# Patient Record
Sex: Female | Born: 1990 | Hispanic: Yes | Marital: Single | Smoking: Never smoker
Health system: Southern US, Community
[De-identification: ages and names within clinical notes are randomized; demographics above are authoritative.]

---

## 2011-05-02 ENCOUNTER — Other Ambulatory Visit (HOSPITAL_COMMUNITY): Payer: Self-pay | Admitting: Family Medicine

## 2011-05-14 ENCOUNTER — Ambulatory Visit (HOSPITAL_COMMUNITY)
Admission: RE | Admit: 2011-05-14 | Discharge: 2011-05-14 | Disposition: A | Discharge status: Home / Self Care | Payer: PRIVATE HEALTH INSURANCE | Source: Ambulatory Visit | Attending: Family Medicine | Admitting: Family Medicine

## 2011-05-14 DIAGNOSIS — N63 Unspecified lump in unspecified breast: Secondary | ICD-10-CM | POA: Insufficient documentation

## 2012-07-30 IMAGING — US US BREAST*L*
1 series · 6 of 6 positions shown · non-contrast
Comparison: None.

CLINICAL DATA: Left breast mass.

LEFT BREAST ULTRASOUND

[Series 1: us breast*left* · 0.08mm/px · 6 of 6 slices shown]
[im 1/6]
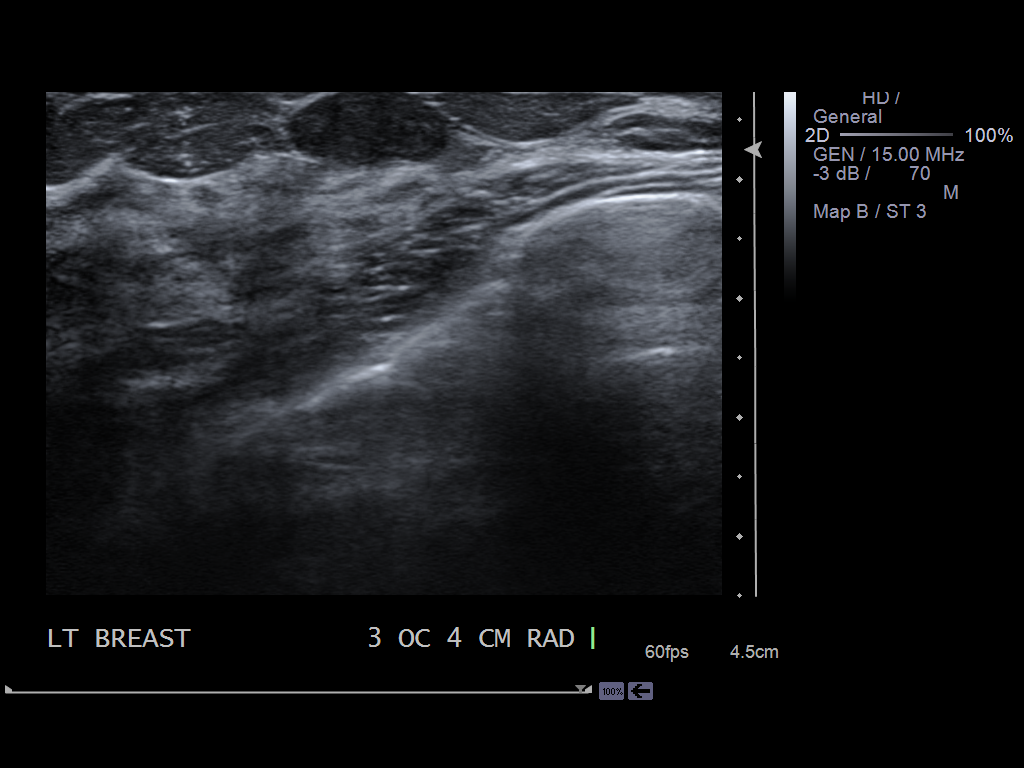
[im 2/6]
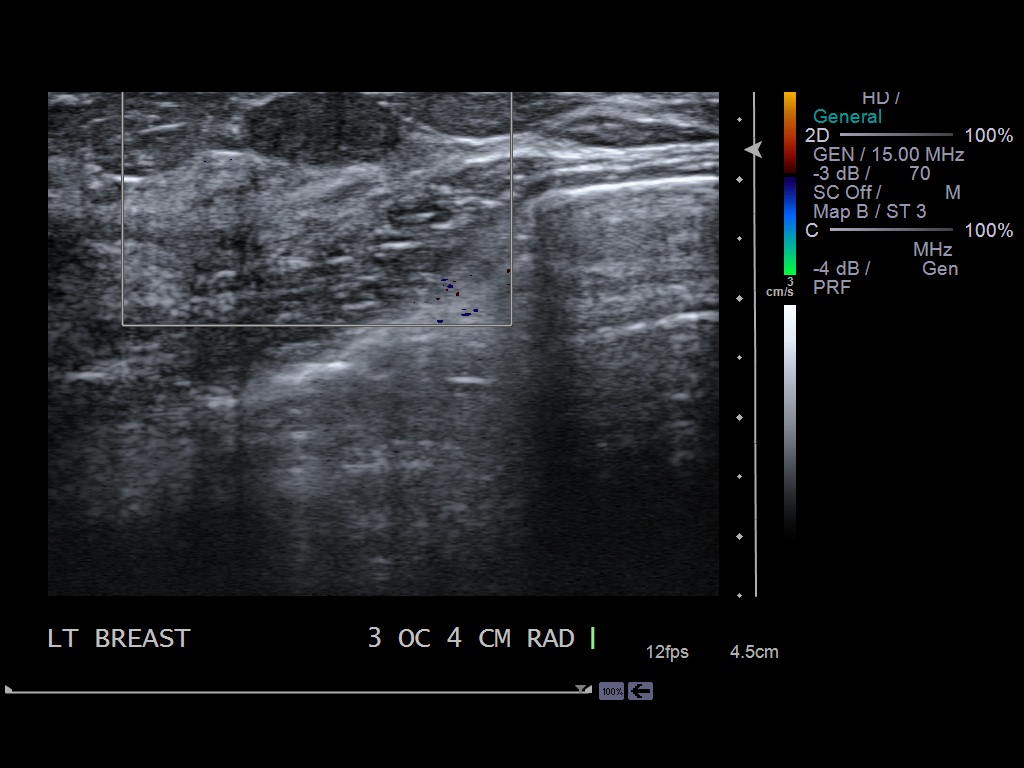
[im 3/6]
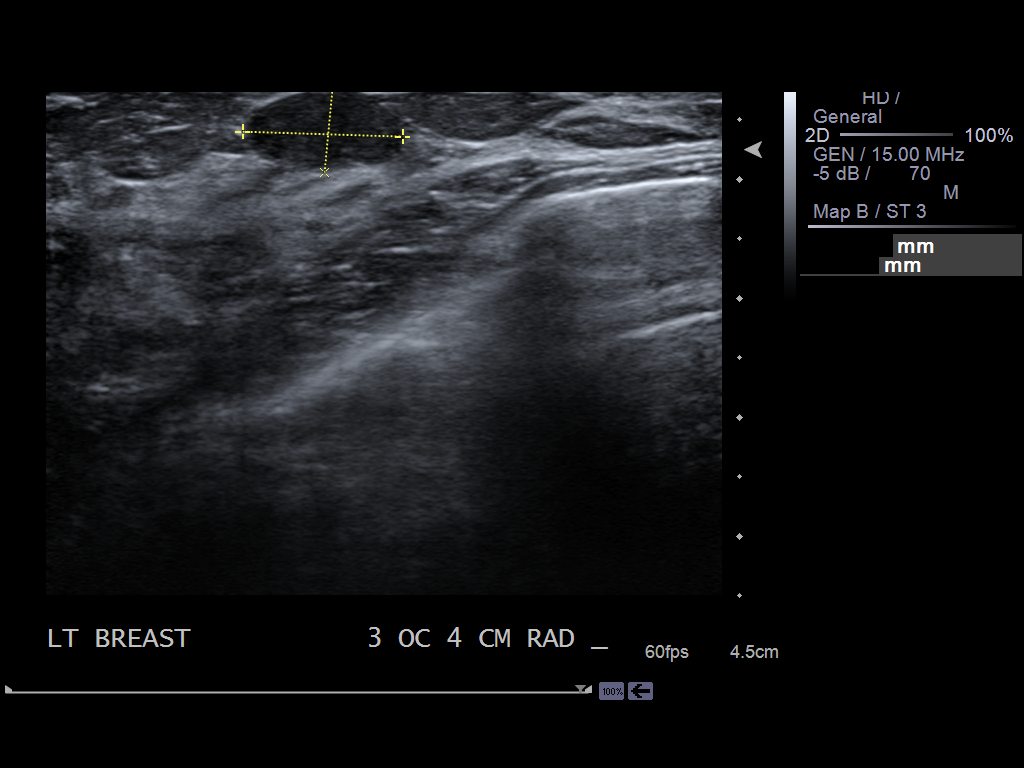
[im 4/6]
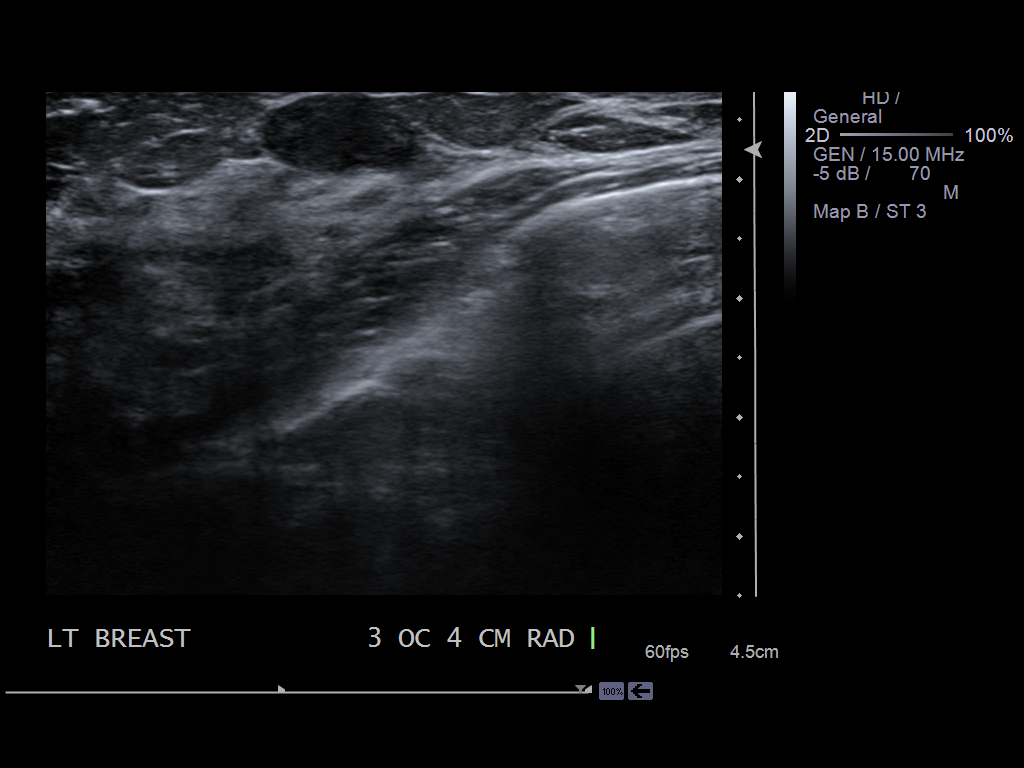
[im 5/6]
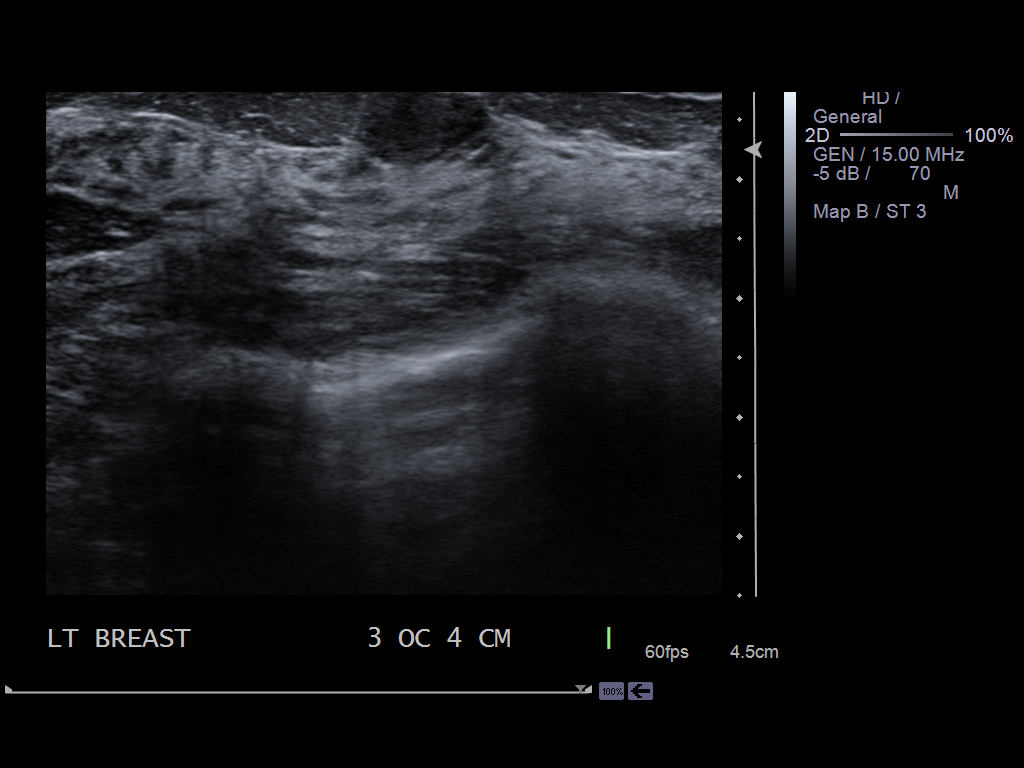
[im 6/6]
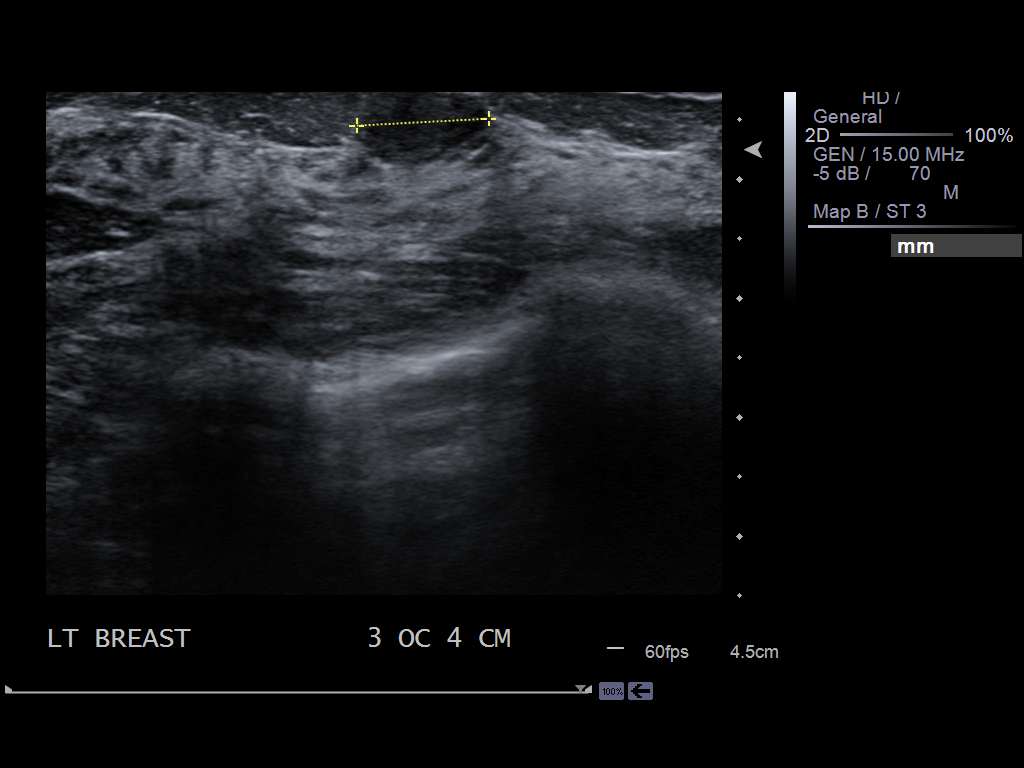

[6 of 6 positions shown; findings below may reference images not displayed]

On physical exam, there is a mobile, firm, palpable mass located
within the left breast at the 3 o'clock position 4 cm from the
nipple.  By physical examination this measures approximately 1 cm
in size.
FINDINGS: Ultrasound is performed, showing an oval, circumscribed,
hypoechoic mass located within the left breast at the 3 o'clock
position 4 cm from nipple corresponding to the palpable finding.
This is oriented parallel to the chest wall.  The features are most
consistent with a probable fibroadenoma. This measures 1.3 x 1.1 x
0.7 cm in size.  I have discussed options of tissue sampling via
ultrasound-guided core biopsy versus ultrasound follow-up
evaluation at six, 12, and 24 months.  The patient prefers
ultrasound follow-up evaluation.  I agree with this plan.  The
patient understands the importance of interim breast self-
examination  and she is instructed to contact  her referring
physician if the mass increases in size in the interim.
IMPRESSION: 1.3 cm probably benign mass (probable fibroadenoma) located within
the left breast at the 3 o'clock position 4 cm from nipple.
Recommend follow-up left breast ultrasound in 6 months.

BI-RADS CATEGORY 3:  Probably benign finding(s) - short interval
follow-up suggested.

## 2016-02-16 ENCOUNTER — Encounter (HOSPITAL_COMMUNITY): Payer: Self-pay | Admitting: Emergency Medicine

## 2016-02-16 ENCOUNTER — Emergency Department (HOSPITAL_COMMUNITY)
Admission: EM | Admit: 2016-02-16 | Discharge: 2016-02-16 | Disposition: A | Discharge status: Home / Self Care | Payer: Self-pay | Attending: Emergency Medicine | Admitting: Emergency Medicine

## 2016-02-16 ENCOUNTER — Emergency Department (HOSPITAL_COMMUNITY): Payer: Self-pay

## 2016-02-16 DIAGNOSIS — L600 Ingrowing nail: Secondary | ICD-10-CM | POA: Insufficient documentation

## 2016-02-16 MED ORDER — CEPHALEXIN 500 MG PO CAPS: 500.0000 mg | ORAL_CAPSULE | Freq: Four times a day (QID) | ORAL | Status: AC

## 2016-02-16 MED ORDER — NAPROXEN 500 MG PO TABS: 500.0000 mg | ORAL_TABLET | Freq: Two times a day (BID) | ORAL | Status: AC

## 2016-02-16 MED ORDER — LIDOCAINE HCL (PF) 2 % IJ SOLN
2.0000 mL | Freq: Once | INTRAMUSCULAR | Status: AC
  Administered 2016-02-16: 2 mL
  Filled 2016-02-16: qty 10

## 2016-02-16 NOTE — ED Provider Notes (Signed)
CSN: [id number]     Arrival date & time 02/16/16  1002 History   First MD Initiated Contact with Patient 02/16/16 1010     Chief Complaint  Patient presents with  . Toe Pain     (Consider location/radiation/quality/duration/timing/severity/associated sxs/prior Treatment) The history is provided by the patient and a relative.   Traci Casey is a 25 y.o. female presenting with an infected ingrown toenail of her left great toe.  She endorses chronic ingrown nail which has worsened in pain, swelling and now drainage of blood and pus since dropping a wooden board on it 3 days ago.  She tried to remove the ingrown nail spike using a tweezers and nail clipper but was unsuccessful.  She denies radiation of pain and other complaint.     History reviewed. No pertinent past medical history. History reviewed. No pertinent past surgical history. History reviewed. No pertinent family history. Social History  Substance Use Topics  . Smoking status: Never Smoker   . Smokeless tobacco: Never Used  . Alcohol Use: No   OB History    Gravida Para Term Preterm AB TAB SAB Ectopic Multiple Living   Review of Systems  Constitutional: Negative for fever.  Musculoskeletal: Positive for arthralgias. Negative for myalgias and joint swelling.  Skin: Positive for color change.  Neurological: Negative for weakness and numbness.      Allergies  Review of patient's allergies indicates no known allergies.  Home Medications   Prior to Admission medications   Medication Sig Start Date End Date Taking? Authorizing Provider  cephALEXin (KEFLEX) 500 MG capsule Take 1 capsule (500 mg total) by mouth 4 (four) times daily. 02/16/16   Burgess Amor, PA-C  naproxen (NAPROSYN) 500 MG tablet Take 1 tablet (500 mg total) by mouth 2 (two) times daily. 02/16/16   Burgess Amor, PA-C   BP 108/72 mmHg  Pulse 98  Temp(Src) 97.9 F (36.6 C) (Oral)  Resp 16  Ht  (1.6 m)  Wt 58.06 kg  BMI 22.68  kg/m2  SpO2 100%  LMP 01/03/2016 Physical Exam  Constitutional: She appears well-developed and well-nourished.  HENT:  Head: Atraumatic.  Neck: Normal range of motion.  Cardiovascular:  Pulses equal bilaterally  Musculoskeletal: She exhibits tenderness.       Left foot: There is swelling. There is normal capillary refill, no crepitus and no deformity.  Edema and erythema with bloody dried drainage around the cuticle edge.  Unable to visualize nail tip due to swelling.  Neurological: She is alert. She has normal strength. She displays normal reflexes. No sensory deficit.  Skin: Skin is warm and dry.  Psychiatric: She has a normal mood and affect.    ED Course  .Nail Removal Date/Time: 02/16/2016 12:20 PM Performed by: Burgess Amor Authorized by: Burgess Amor Consent: Verbal consent obtained. Risks and benefits: risks, benefits and alternatives were discussed Consent given by: patient Patient identity confirmed: verbally with patient Location: left foot Location details: left big toe Anesthesia: digital block Local anesthetic: lidocaine 2% without epinephrine Anesthetic total: 3 ml Preparation: skin prepped with Betadine Amount removed: 1/5 (distal wedge spike) Wedge excision of skin of nail fold: no Nail bed sutured: no Nail matrix removed: none Patient tolerance: Patient tolerated the procedure well with no immediate complications   (including critical care time)   Labs Review Labs Reviewed - No data to display  Imaging Review Dg Toe Great Left  02/16/2016  CLINICAL DATA:  25 year old female with a history of left great toe injury EXAM: LEFT GREAT TOE COMPARISON:  None. FINDINGS: No acute fracture. No radiopaque foreign body. Unremarkable appearance of the interphalangeal joint. No subluxation/dislocation. IMPRESSION: No acute bony abnormality identified. Signed, Yvone NeuJaime S. Loreta AveWagner, DO [facility] Electronically  Signed   By: Gilmer MorJaime  Wagner D.O.   On: 02/16/2016 11:43   I have personally reviewed and evaluated these images and lab results as part of my medical decision-making.   EKG Interpretation None      MDM   Final diagnoses:  Ingrown left greater toenail    Keflex, naproxen, warm soaks, prn f/u with podiatry, referral given.    Burgess AmorJulie Nicolaos Mitrano, PA-C 02/16/16 1224  Samuel JesterKathleen McManus, DO 02/17/16 [phone]

## 2016-02-16 NOTE — ED Notes (Signed)
Patient c/o left great toe pain. Per patient has ingrown toe tail and injured toe 3 days ago at work making pain worse.

## 2016-02-16 NOTE — Discharge Instructions (Signed)
Ingrown Toenail An ingrown toenail occurs when the corner or sides of your toenail grow into the surrounding skin. The big toe is most commonly affected, but it can happen to any of your toes. If your ingrown toenail is not treated, you will be at risk for infection. CAUSES This condition may be caused by:  Wearing shoes that are too small or tight.  Injury or trauma, such as stubbing your toe or having your toe stepped on.  Improper cutting or care of your toenails.  Being born with (congenital) nail or foot abnormalities, such as having a nail that is too big for your toe. RISK FACTORS Risk factors for an ingrown toenail include:  Age. Your nails tend to thicken as you get older, so ingrown nails are more common in older people.  Diabetes.  Cutting your toenails incorrectly.  Blood circulation problems. SYMPTOMS Symptoms may include:  Pain, soreness, or tenderness.  Redness.  Swelling.  Hardening of the skin surrounding the toe. Your ingrown toenail may be infected if there is fluid, pus, or drainage. DIAGNOSIS  An ingrown toenail may be diagnosed by medical history and physical exam. If your toenail is infected, your health care provider may test a sample of the drainage. TREATMENT Treatment depends on the severity of your ingrown toenail. Some ingrown toenails may be treated at home. More severe or infected ingrown toenails may require surgery to remove all or part of the nail. Infected ingrown toenails may also be treated with antibiotic medicines. HOME CARE INSTRUCTIONS  If you were prescribed an antibiotic medicine, finish all of it even if you start to feel better.  Soak your foot in warm  water mixed with EPSOM SALT for 20 minutes, 2 times per day or as directed by your health care provider.  Carefully lift the edge of the nail away from the sore skin by wedging a small piece of cotton under the corner of the nail. This may help with the pain. Be careful not to  cause more injury to the area.  Wear shoes that fit well. If your ingrown toenail is causing you pain, try wearing sandals, if possible.  Trim your toenails regularly and carefully. Do not cut them in a curved shape. Cut your toenails straight across. This prevents injury to the skin at the corners of the toenail.  Keep your feet clean and dry.  If you are having trouble walking and are given crutches by your health care provider, use them as directed.  Do not pick at your toenail or try to remove it yourself.  Take medicines only as directed by your health care provider.  Keep all follow-up visits as directed by your health care provider. This is important. SEEK MEDICAL CARE IF:  Your symptoms do not improve with treatment. SEEK IMMEDIATE MEDICAL CARE IF:  You have red streaks that start at your foot and go up your leg.  You have a fever.  You have increased redness, swelling, or pain.  You have fluid, blood, or pus coming from your toenail.   This information is not intended to replace advice given to you by your health care provider. Make sure you discuss any questions you have with your health care provider.   Document Released: 07/18/2000 Document Revised: 12/05/2014 Document Reviewed: 06/14/2014 Elsevier Interactive Patient Education [employer]2016 [employer]

## 2017-05-04 IMAGING — CR DG TOE GREAT 2+V*L*
3 series · 3 of 3 positions shown · non-contrast
Comparison: None.

CLINICAL DATA: 25-year-old female with a history of left great toe
injury

EXAM:
LEFT GREAT TOE

[ap]
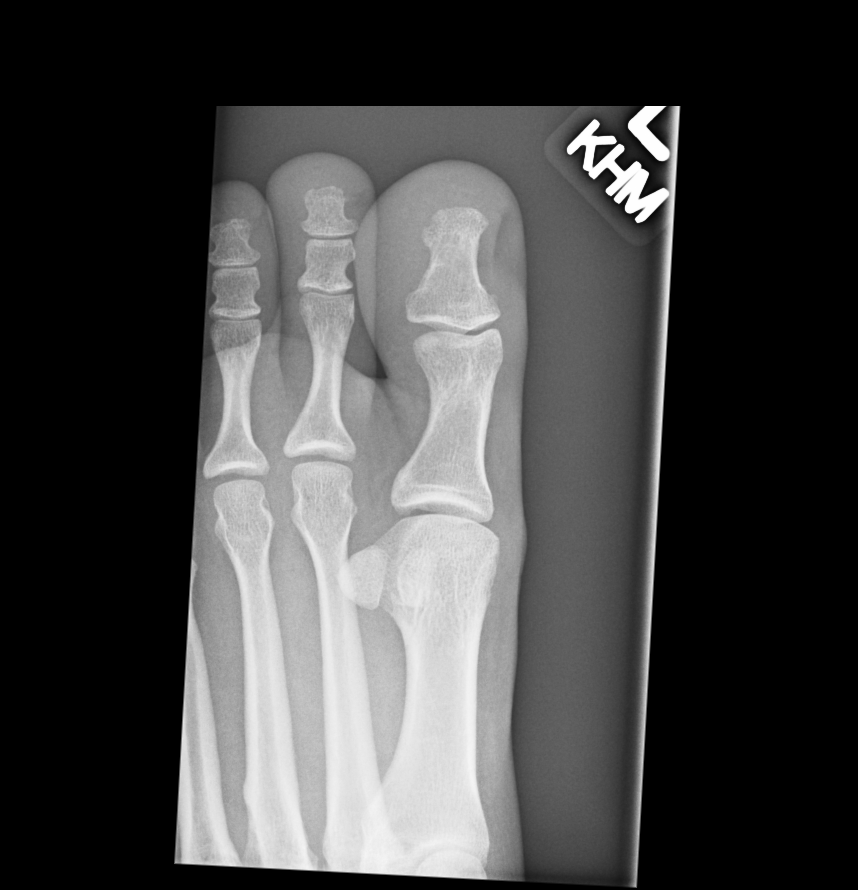

[oblique]
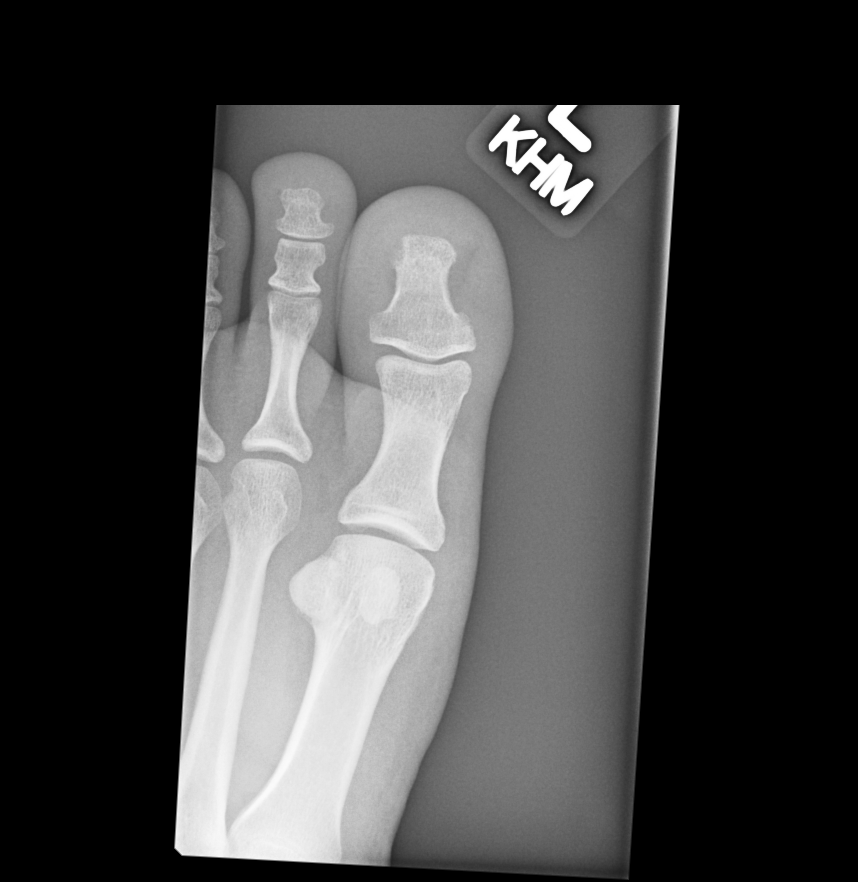

[lat]
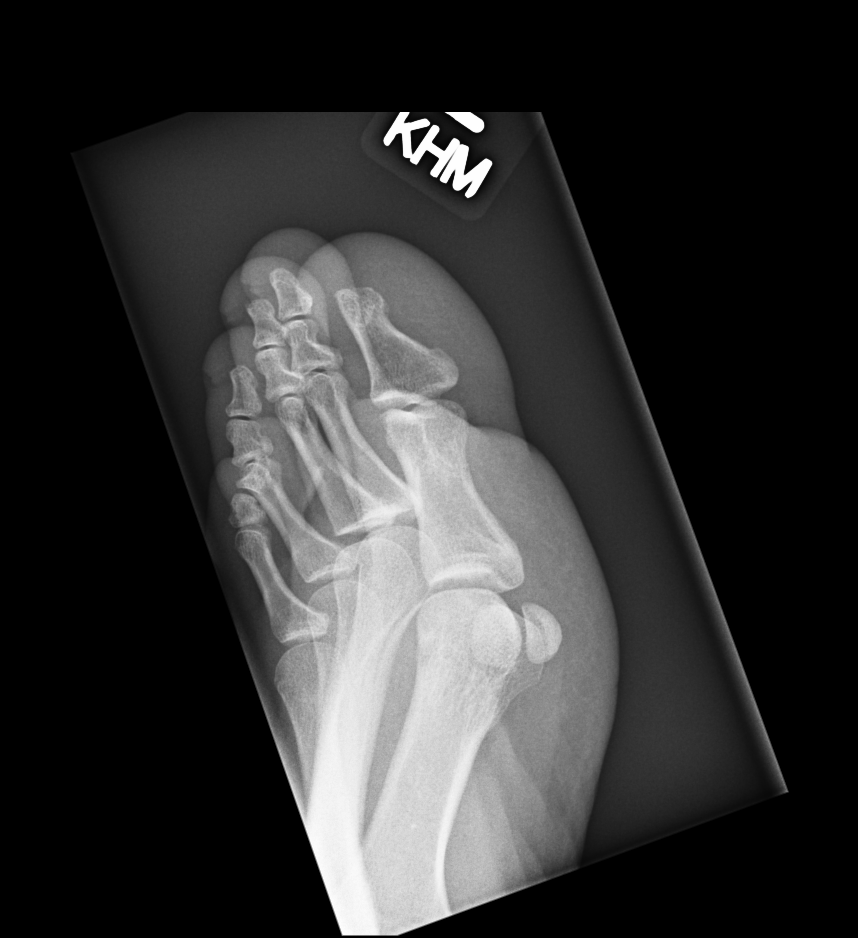

[3 of 3 positions shown; findings below may reference images not displayed]

FINDINGS: No acute fracture. No radiopaque foreign body. Unremarkable
appearance of the interphalangeal joint. No subluxation/dislocation.
IMPRESSION: No acute bony abnormality identified.

## 2017-11-12 ENCOUNTER — Other Ambulatory Visit: Payer: Self-pay | Admitting: Family Medicine

## 2017-11-12 DIAGNOSIS — N644 Mastodynia: Secondary | ICD-10-CM

## 2017-11-18 ENCOUNTER — Other Ambulatory Visit: Payer: PRIVATE HEALTH INSURANCE
